# Patient Record
Sex: Male | Born: 2000 | Race: White | Hispanic: Yes | Marital: Single
Health system: Southern US, Community
[De-identification: ages and names within clinical notes are randomized; demographics above are authoritative.]

## PROBLEM LIST (undated history)

## (undated) DIAGNOSIS — J45909 Unspecified asthma, uncomplicated: Secondary | ICD-10-CM

---
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
## 2021-07-04 ENCOUNTER — Emergency Department (HOSPITAL_COMMUNITY)
Admission: EM | Admit: 2021-07-04 | Discharge: 2021-07-05 | Disposition: A | Discharge status: Home / Self Care | Payer: Medicaid Other | Attending: Emergency Medicine | Admitting: Emergency Medicine

## 2021-07-04 ENCOUNTER — Other Ambulatory Visit: Payer: Self-pay

## 2021-07-04 ENCOUNTER — Encounter (HOSPITAL_COMMUNITY): Payer: Self-pay | Admitting: Emergency Medicine

## 2021-07-04 DIAGNOSIS — R519 Headache, unspecified: Secondary | ICD-10-CM | POA: Diagnosis present

## 2021-07-04 DIAGNOSIS — Z91010 Allergy to peanuts: Secondary | ICD-10-CM | POA: Insufficient documentation

## 2021-07-04 DIAGNOSIS — J4521 Mild intermittent asthma with (acute) exacerbation: Secondary | ICD-10-CM | POA: Diagnosis not present

## 2021-07-04 DIAGNOSIS — J101 Influenza due to other identified influenza virus with other respiratory manifestations: Secondary | ICD-10-CM | POA: Insufficient documentation

## 2021-07-04 DIAGNOSIS — Z20822 Contact with and (suspected) exposure to covid-19: Secondary | ICD-10-CM | POA: Insufficient documentation

## 2021-07-04 DIAGNOSIS — M25532 Pain in left wrist: Secondary | ICD-10-CM | POA: Diagnosis not present

## 2021-07-04 DIAGNOSIS — M79641 Pain in right hand: Secondary | ICD-10-CM | POA: Diagnosis not present

## 2021-07-04 NOTE — ED Triage Notes (Signed)
Pt with flu-like symptoms for 2 days.  Poor appetite, cough, fever, chills, body aches. OTC meds given by mom.  Afebrile at time of triage.

## 2021-07-05 ENCOUNTER — Emergency Department (HOSPITAL_COMMUNITY): Payer: Medicaid Other

## 2021-07-05 LAB — RESP PANEL BY RT-PCR (FLU A&B, COVID) ARPGX2
Influenza A by PCR: POSITIVE — AB
Influenza B by PCR: NEGATIVE
SARS Coronavirus 2 by RT PCR: NEGATIVE

## 2021-07-05 MED ORDER — AEROCHAMBER PLUS FLO-VU MEDIUM MISC
1.0000 | Freq: Once | Status: AC
Start: 2021-07-05 — End: 2021-07-05
  Administered 2021-07-05: 1
  Filled 2021-07-05: qty 1

## 2021-07-05 MED ORDER — PREDNISONE 20 MG PO TABS
60.0000 mg | ORAL_TABLET | Freq: Once | ORAL | Status: AC
  Administered 2021-07-05: 60 mg via ORAL
  Filled 2021-07-05: qty 3

## 2021-07-05 MED ORDER — PREDNISONE 20 MG PO TABS
60.0000 mg | ORAL_TABLET | Freq: Once | ORAL | Status: DC
  Filled 2021-07-05: qty 3

## 2021-07-05 MED ORDER — ALBUTEROL SULFATE HFA 108 (90 BASE) MCG/ACT IN AERS
2.0000 | INHALATION_SPRAY | RESPIRATORY_TRACT | Status: DC | PRN
Start: 2021-07-05 — End: 2021-07-05
  Administered 2021-07-05: 2 via RESPIRATORY_TRACT
  Filled 2021-07-05 (×2): qty 6.7

## 2021-07-05 MED ORDER — PREDNISONE 20 MG PO TABS: 40.0000 mg | ORAL_TABLET | Freq: Every day | ORAL | 0 refills | Status: AC

## 2021-07-05 MED ORDER — IBUPROFEN 800 MG PO TABS
800.0000 mg | ORAL_TABLET | Freq: Once | ORAL | Status: AC
  Administered 2021-07-05: 800 mg via ORAL
  Filled 2021-07-05: qty 1

## 2021-07-05 MED ORDER — IBUPROFEN 400 MG PO TABS
600.0000 mg | ORAL_TABLET | Freq: Once | ORAL | Status: DC
  Filled 2021-07-05: qty 1

## 2021-07-05 MED ORDER — BENZONATATE 100 MG PO CAPS: 100.0000 mg | ORAL_CAPSULE | Freq: Three times a day (TID) | ORAL | 0 refills | Status: AC | PRN

## 2021-07-05 NOTE — Discharge Instructions (Signed)
1. Medications: Tessalon, Albuterol, usual home medications 2. Treatment: rest, drink plenty of fluids, alternate tylenol and ibuprofen for fever control - do not exceed 4g of tylenol including tylenol in other cold and flu medications 3. Follow Up: Please followup with your primary doctor in 3-5 days for discussion of your diagnoses and further evaluation after today's visit; if you do not have a primary care doctor use the resource guide provided to find one; Please return to the ER for difficulty breathing, inability to keep down fluids, altered mental status or other concerns.

## 2021-07-05 NOTE — ED Provider Notes (Signed)
Emergency Medicine Provider Triage Evaluation Note  Ethan Horton , a 20 y.o. male  was evaluated in triage.  Pt complains of flu-like symptoms onset yesterday.  Pt reports taking "all the medications" including CBD today without improvement.  Mother reports she gave acetaminophen at 6pm with some improvement in fever, but then symptoms returned.  Pt has a hz of asthma but has not been hospitalized since he was a small child.  Does not remember the last time he had prednisone. Pt reports he is most frustrated with his headache.   Pt reports he also became annoyed with his roommate today and punched a wall.  He is now having right hand and left wrist pain.   Review of Systems  Positive: Fever, chills, cough, headache, fatigue, myalgias, SOB Negative: N/V/D  Physical Exam  BP (!) 149/94 (BP Location: Left Arm)   Pulse 85   Temp 99.4 F (37.4 C) (Oral)   Resp 17   SpO2 98%  Gen:   Awake, no distress   Resp:  Normal effort, diminished, but clear breath sounds MSK:   Moves extremities without difficulty  Other:  Speaks in full sentences  Medical Decision Making  Medically screening exam initiated at 12:08 AM.  Appropriate orders placed.  Ethan Horton was informed that the remainder of the evaluation will be completed by another provider, this initial triage assessment does not replace that evaluation, and the importance of remaining in the ED until their evaluation is complete.  Influenza like illness.  Will treat asthma.   Punched a wall - right hand and left wrist pain - x-rays pending.   Ethan Horton, Ethan Horton 07/05/21 0011    Nira Conn, MD 07/05/21 [phone]

## 2021-07-05 NOTE — ED Provider Notes (Signed)
[facility] EMERGENCY DEPARTMENT Provider Note   CSN: [id number] Arrival date & time: 07/04/21  2319     History Chief Complaint  Patient presents with   Generalized Body Aches    Ethan Horton is a 20 y.o. male presents to the ER with flu-like symptoms onset yesterday.  Pt reports taking "all the medications" including CBD today without improvement.  Pt reports he smokes marijuana every day. Mother reports she gave acetaminophen at 6pm with some improvement in fever, but then symptoms returned.  Pt has a hx of asthma but has not been hospitalized since he was a small child.  Does not remember the last time he had prednisone. Pt reports he is most frustrated with his headache.    Pt reports he also became annoyed with his roommate today and punched a wall with both hands.  He is now having right hand and left wrist pain. No open wounds.   The history is provided by the patient, a parent and medical records. No language interpreter was used.      Past Medical History:  Diagnosis Date   Asthma     There are no problems to display for this patient.   No past surgical history on file.     No family history on file.  Social History   Substance Use Topics   Alcohol use: No    Home Medications Prior to Admission medications   Medication Sig Start Date End Date Taking? Authorizing Provider  benzonatate (TESSALON PERLES) 100 MG capsule Take 1 capsule (100 mg total) by mouth 3 (three) times daily as needed for cough (cough). 07/05/21  Yes Winford Hehn, Jarrett Soho, PA-C  predniSONE (DELTASONE) 20 MG tablet Take 2 tablets (40 mg total) by mouth daily. 07/05/21  Yes Ardie Dragoo, Jarrett Soho, PA-C  albuterol (PROVENTIL HFA;VENTOLIN HFA) 108 (90 Base) MCG/ACT inhaler Inhale 2 puffs into the lungs every 4 (four) hours as needed for wheezing or shortness of breath.    [provider]  cetirizine (ZYRTEC) 10 MG tablet Take 10 mg by mouth daily.    [provider]  dicyclomine (BENTYL) 10 MG capsule Take 10 mg by mouth 4 (four) times daily -  before meals and at bedtime.    [provider]  ondansetron (ZOFRAN ODT) 4 MG disintegrating tablet Take 1 tablet (4 mg total) by mouth every 8 (eight) hours as needed for nausea or vomiting. 08/18/17   Virgel Manifold, MD    Allergies    Peanut butter flavor and Wasp venom  Review of Systems   Review of Systems  Constitutional:  Positive for chills, fatigue and fever. Negative for appetite change, diaphoresis and unexpected weight change.  HENT:  Positive for congestion and sore throat. Negative for mouth sores.   Eyes:  Negative for visual disturbance.  Respiratory:  Positive for cough, shortness of breath and wheezing. Negative for chest tightness.   Cardiovascular:  Negative for chest pain.  Gastrointestinal:  Negative for abdominal pain, constipation, diarrhea, nausea and vomiting.  Endocrine: Negative for polydipsia, polyphagia and polyuria.  Genitourinary:  Negative for dysuria, frequency, hematuria and urgency.  Musculoskeletal:  Positive for myalgias. Negative for back pain and neck stiffness.  Skin:  Negative for rash.  Allergic/Immunologic: Negative for immunocompromised state.  Neurological:  Positive for headaches. Negative for syncope and light-headedness.  Hematological:  Does not bruise/bleed easily.  Psychiatric/Behavioral:  Negative for sleep disturbance. The patient is not nervous/anxious.    Physical Exam Updated Vital Signs BP Marland Kitchen)  149/94 (BP Location: Left Arm)   Pulse 85   Temp 99.4 F (37.4 C) (Oral)   Resp 17   SpO2 98%   Physical Exam Vitals and nursing note reviewed.  Constitutional:      General: He is not in acute distress.    Appearance: He is not diaphoretic.  HENT:     Head: Normocephalic.  Eyes:     General: No scleral icterus.    Conjunctiva/sclera: Conjunctivae normal.  Cardiovascular:     Rate and Rhythm: Normal rate and regular rhythm.      Pulses: Normal pulses.          Radial pulses are 2+ on the right side and 2+ on the left side.  Pulmonary:     Effort: Pulmonary effort is normal. No tachypnea, accessory muscle usage, prolonged expiration, respiratory distress or retractions.     Breath sounds: Normal breath sounds. No stridor.     Comments: Equal chest rise. No increased work of breathing. Abdominal:     General: There is no distension.     Palpations: Abdomen is soft.     Tenderness: There is no abdominal tenderness. There is no guarding or rebound.  Musculoskeletal:     Right elbow: Normal.     Left elbow: Normal.     Right forearm: Normal.     Left forearm: Normal.     Right wrist: Normal.     Left wrist: Tenderness present. Normal range of motion.     Right hand: Tenderness present. No swelling, lacerations or bony tenderness. Normal range of motion. Normal strength. Normal sensation.     Left hand: Normal.     Cervical back: Normal range of motion.     Comments: Moves all extremities equally and without difficulty.  Skin:    General: Skin is warm and dry.     Capillary Refill: Capillary refill takes less than 2 seconds.  Neurological:     Mental Status: He is alert.     GCS: GCS eye subscore is 4. GCS verbal subscore is 5. GCS motor subscore is 6.     Comments: Speech is clear and goal oriented.  Psychiatric:        Mood and Affect: Mood normal.    ED Results / Procedures / Treatments   Labs (all labs ordered are listed, but only abnormal results are displayed) Labs Reviewed  RESP PANEL BY RT-PCR (FLU A&B, COVID) ARPGX2 - Abnormal; Notable for the following components:      Result Value   Influenza A by PCR POSITIVE (*)    All other components within normal limits     Radiology DG Wrist Complete Left  Result Date: 07/05/2021 CLINICAL DATA:  Initial evaluation for acute trauma. EXAM: LEFT WRIST - COMPLETE 3+ VIEW COMPARISON:  None. FINDINGS: There is no evidence of fracture or dislocation.  There is no evidence of arthropathy or other focal bone abnormality. Soft tissues are unremarkable. IMPRESSION: Negative. Electronically Signed   By: Jeannine Boga M.D.   On: 07/05/2021 00:45   DG Hand Complete Right  Result Date: 07/05/2021 CLINICAL DATA:  Initial evaluation for acute trauma. EXAM: RIGHT HAND - COMPLETE 3+ VIEW COMPARISON:  None. FINDINGS: There is no evidence of fracture or dislocation. There is no evidence of arthropathy or other focal bone abnormality. Soft tissues are unremarkable. IMPRESSION: Negative. Electronically Signed   By: Jeannine Boga M.D.   On: 07/05/2021 00:44    Procedures Procedures   Medications Ordered in  ED Medications  albuterol (VENTOLIN HFA) 108 (90 Base) MCG/ACT inhaler 2 puff (2 puffs Inhalation Given 07/05/21 0443)  AeroChamber Plus Flo-Vu Medium MISC 1 each (1 each Other Given 07/05/21 0444)  predniSONE (DELTASONE) tablet 60 mg (60 mg Oral Given 07/05/21 0443)  ibuprofen (ADVIL) tablet 800 mg (800 mg Oral Given 07/05/21 0443)    ED Course  I have reviewed the triage vital signs and the nursing notes.  Pertinent labs & imaging results that were available during my care of the patient were reviewed by me and considered in my medical decision making (see chart for details).    MDM Rules/Calculators/A&P                           Patient with symptoms consistent with influenza.  Vitals are stable, low-grade fever.  No signs of dehydration, tolerating PO's.  Pt initially with coarse breath sounds but no wheezing at triage - clear and equal on repeat exam. Due to patient's presentation and physical exam a chest x-ray was not ordered bc likely diagnosis of flu.    4:28 AM Influenza positive. X-rays negative.  Patient will be discharged with instructions to orally hydrate, rest, and use over-the-counter medications such as anti-inflammatories ibuprofen and Aleve for muscle aches and Tylenol for fever.  Patient will also be given a  cough suppressant, and albuterol.   Will treat possible asthma exacerbation with Prednisone. Pt given albuterol here in the ED.    Final Clinical Impression(s) / ED Diagnoses Final diagnoses:  Influenza A  Mild intermittent asthma with exacerbation    Rx / DC Orders ED Discharge Orders          Ordered    predniSONE (DELTASONE) 20 MG tablet  Daily        07/05/21 0427    benzonatate (TESSALON PERLES) 100 MG capsule  3 times daily PRN        07/05/21 0427             Lynton Crescenzo, Dahlia Client, PA-C 07/05/21 0456    Cardama, Amadeo Garnet, MD 07/05/21 [phone]

## 2021-12-20 IMAGING — CR DG HAND COMPLETE 3+V*R*
4 series · 4 of 4 positions shown · non-contrast
Comparison: None.

CLINICAL DATA: Initial evaluation for acute trauma.

EXAM:
RIGHT HAND - COMPLETE 3+ VIEW

[hand pa]
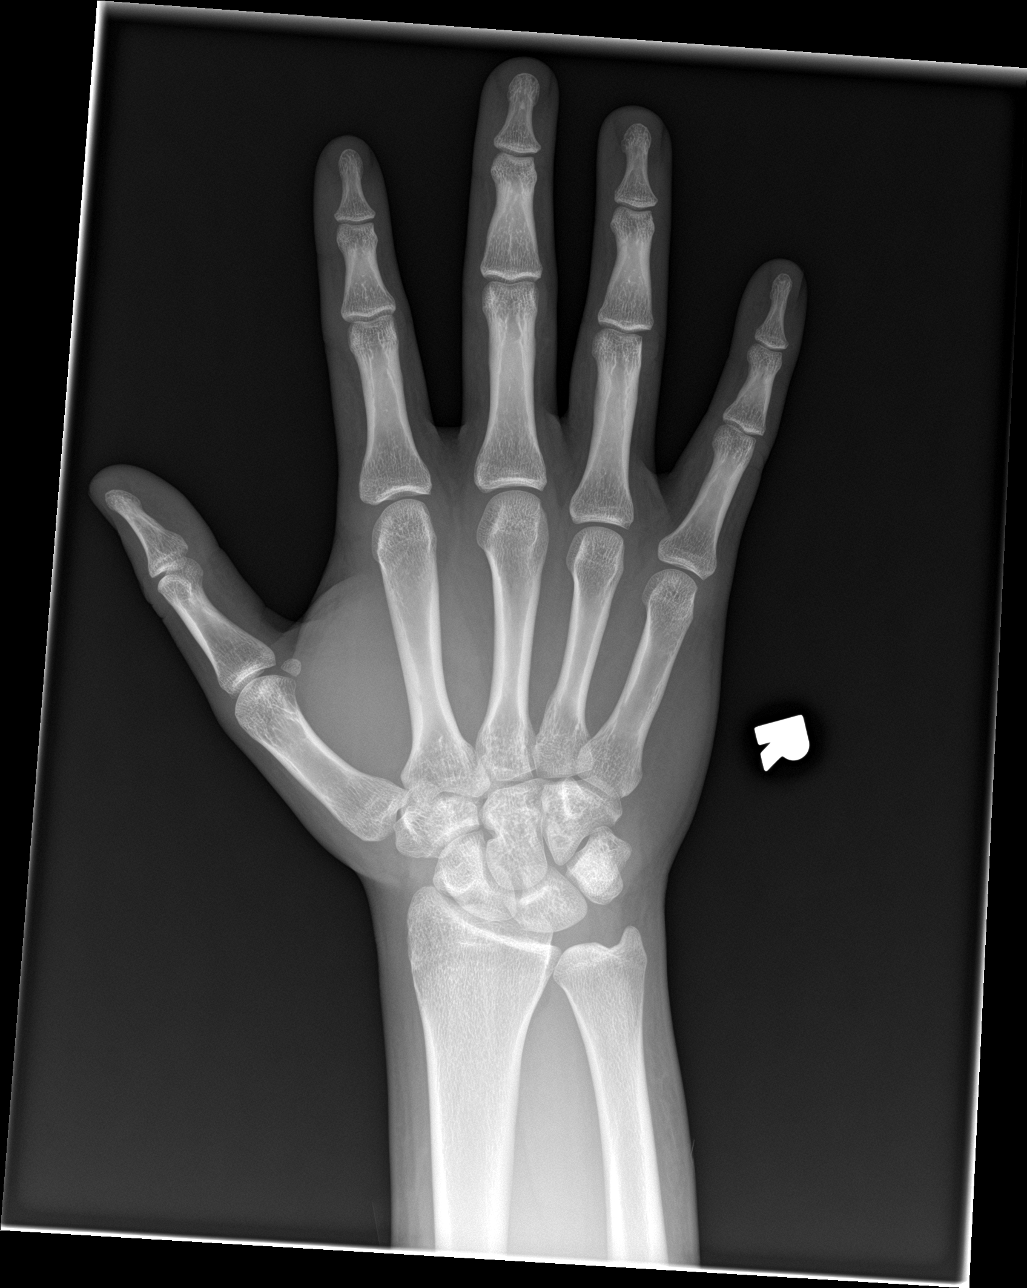

[hand obl]
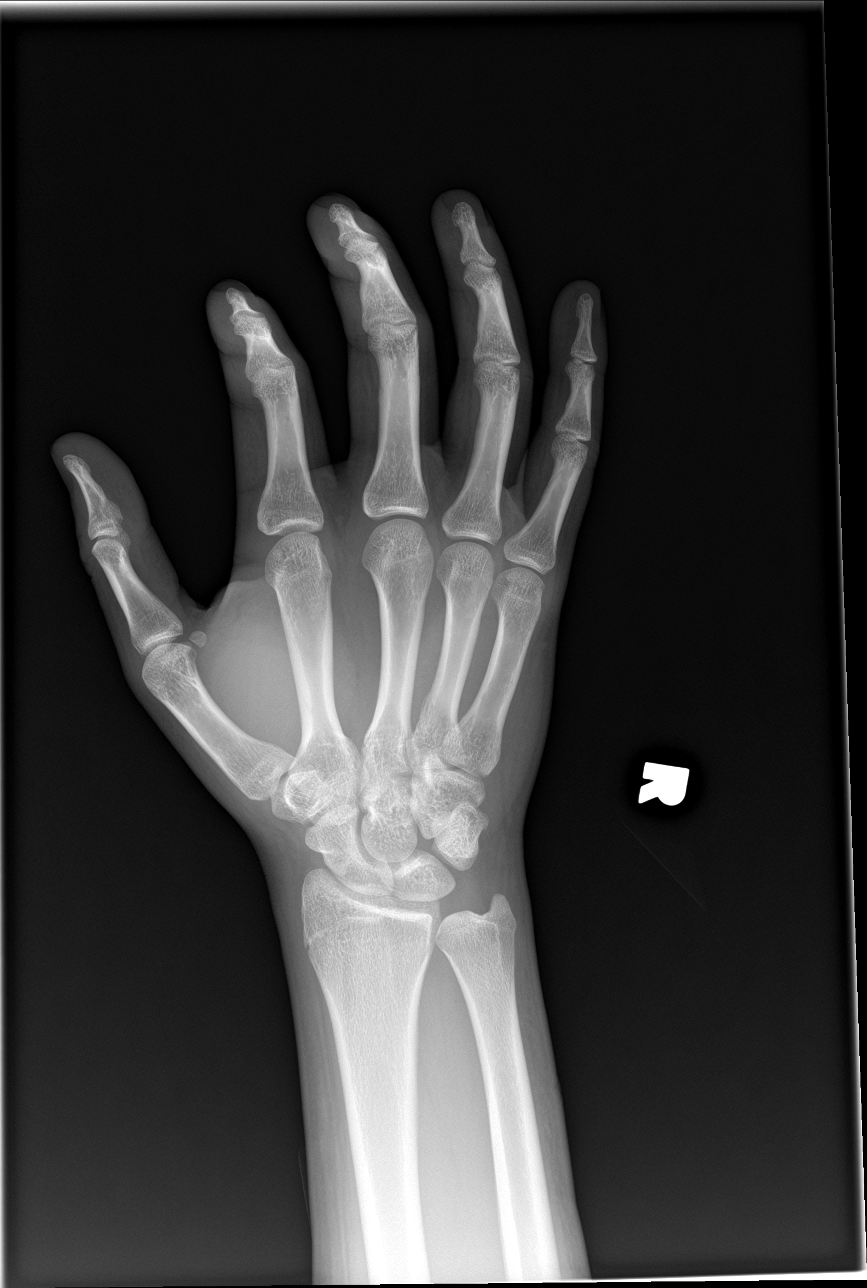

[hand lat (1 of 2)]
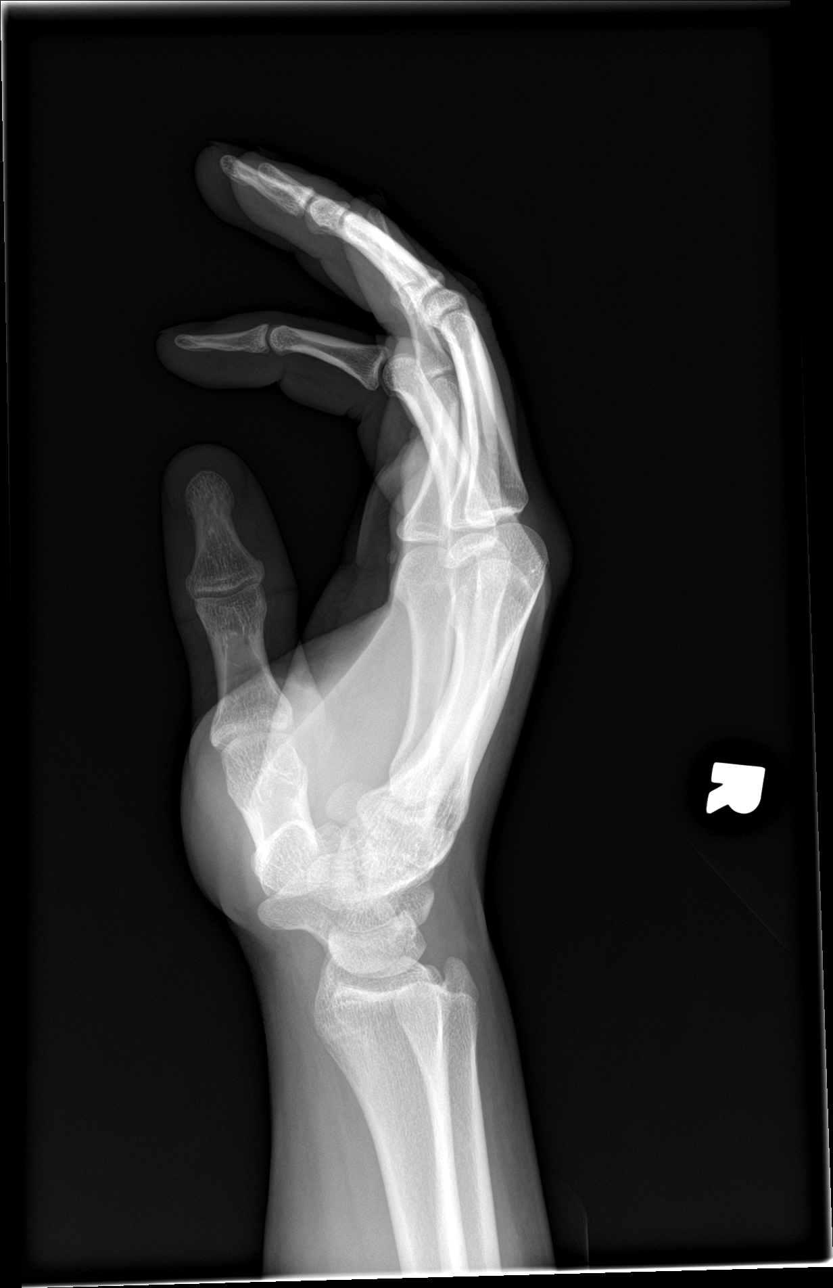

[hand lat (2 of 2)]
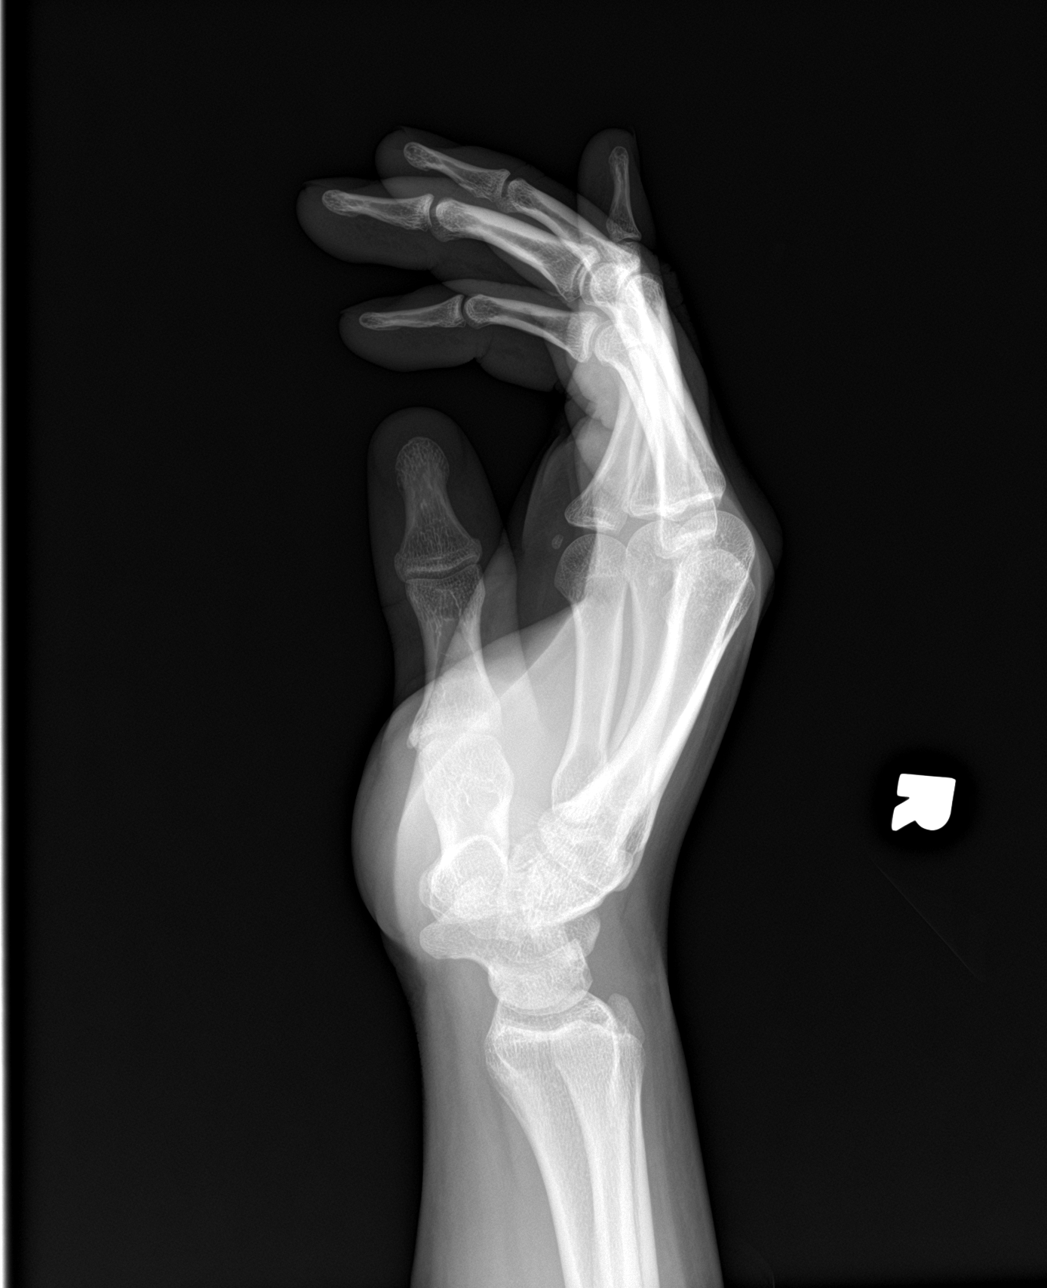

[4 of 4 positions shown; findings below may reference images not displayed]

FINDINGS: There is no evidence of fracture or dislocation. There is no
evidence of arthropathy or other focal bone abnormality. Soft
tissues are unremarkable.
IMPRESSION: Negative.

## 2024-06-15 ENCOUNTER — Ambulatory Visit
Admission: EM | Admit: 2024-06-15 | Discharge: 2024-06-15 | Disposition: A | Discharge status: Home / Self Care | Attending: Urgent Care | Admitting: Urgent Care

## 2024-06-15 ENCOUNTER — Ambulatory Visit: Admission: EM | Admit: 2024-06-15 | Discharge: 2024-06-15 | Discharge status: Against Medical Advice

## 2024-06-15 DIAGNOSIS — L0100 Impetigo, unspecified: Secondary | ICD-10-CM

## 2024-06-15 MED ORDER — MUPIROCIN 2 % EX OINT: 1.0000 | TOPICAL_OINTMENT | Freq: Three times a day (TID) | CUTANEOUS | 0 refills | Status: AC

## 2024-06-15 MED ORDER — DOXYCYCLINE HYCLATE 100 MG PO CAPS: 100.0000 mg | ORAL_CAPSULE | Freq: Two times a day (BID) | ORAL | 0 refills | Status: AC

## 2024-06-15 NOTE — ED Notes (Signed)
 Pt did not return to clinic.  LWBS

## 2024-06-15 NOTE — ED Triage Notes (Signed)
 Pt states rash to his face for the past 3 weeks.  States he is allergic to peanuts and has been eating candy with peanuts since halloween.  Red rash noted around mouth.

## 2024-06-15 NOTE — ED Notes (Signed)
 Pt not in waiting room when called to room.  Told patient accesses that he would be right back.

## 2024-06-15 NOTE — ED Provider Notes (Signed)
 [facility]  Note:  This document was prepared using Conservation officer, historic buildings and may include unintentional dictation errors.  MRN: [id number] DOB: 03-17-2001  Subjective:   Ethan Horton is a 23 y.o. male presenting for 3-week history of persistent worsening rash over the lower part of his face.  Patient has used multiple topical treatments but is not helping.  The area continues to crust over, sometimes oozes.  No facial or oral swelling, difficulty breathing, nausea, vomiting.  No current facility-administered medications for this encounter.  Current Outpatient Medications:    albuterol  (PROVENTIL  HFA;VENTOLIN  HFA) 108 (90 Base) MCG/ACT inhaler, Inhale 2 puffs into the lungs every 4 (four) hours as needed for wheezing or shortness of breath., Disp: , Rfl:    benzonatate  (TESSALON  PERLES) 100 MG capsule, Take 1 capsule (100 mg total) by mouth 3 (three) times daily as needed for cough (cough)., Disp: 20 capsule, Rfl: 0   cetirizine (ZYRTEC) 10 MG tablet, Take 10 mg by mouth daily., Disp: , Rfl:    dicyclomine (BENTYL) 10 MG capsule, Take 10 mg by mouth 4 (four) times daily -  before meals and at bedtime., Disp: , Rfl:    ondansetron  (ZOFRAN  ODT) 4 MG disintegrating tablet, Take 1 tablet (4 mg total) by mouth every 8 (eight) hours as needed for nausea or vomiting., Disp: 10 tablet, Rfl: 0   predniSONE  (DELTASONE ) 20 MG tablet, Take 2 tablets (40 mg total) by mouth daily., Disp: 10 tablet, Rfl: 0   Allergies  Allergen Reactions   Peanut Butter Flavoring Agent (Non-Screening) Anaphylaxis   Wasp Venom Anaphylaxis    Past Medical History:  Diagnosis Date   Asthma      History reviewed. No pertinent surgical history.  History reviewed. No pertinent family history.  Social History   Tobacco Use   Smoking status: Never   Smokeless tobacco: Never  Substance Use Topics   Alcohol use: Yes   Drug use: Yes    Types: Marijuana    ROS   Objective:    Vitals: BP 130/84 (BP Location: Right Arm)   Pulse (!) 59   Temp 98.7 F (37.1 C) (Oral)   Resp 20   SpO2 98%   Physical Exam Constitutional:      General: He is not in acute distress.    Appearance: Normal appearance. He is well-developed and normal weight. He is not ill-appearing, toxic-appearing or diaphoretic.  HENT:     Head: Normocephalic and atraumatic.      Comments: No oral swelling, facial swelling.    Right Ear: External ear normal.     Left Ear: External ear normal.     Nose: Nose normal.     Mouth/Throat:     Pharynx: Oropharynx is clear. No pharyngeal swelling, oropharyngeal exudate, posterior oropharyngeal erythema or uvula swelling.     Tonsils: No tonsillar exudate or tonsillar abscesses. 0 on the right. 0 on the left.  Eyes:     General: No scleral icterus.       Right eye: No discharge.        Left eye: No discharge.     Extraocular Movements: Extraocular movements intact.  Cardiovascular:     Rate and Rhythm: Normal rate.  Pulmonary:     Effort: Pulmonary effort is normal.  Musculoskeletal:     Cervical back: Normal range of motion.  Neurological:     Mental Status: He is alert and oriented to person, place, and time.  Psychiatric:  Mood and Affect: Mood normal.        Behavior: Behavior normal.        Thought Content: Thought content normal.        Judgment: Judgment normal.     Assessment and Plan :   PDMP not reviewed this encounter.  1. Impetigo    Patient has significant impetigo and therefore recommended doxycycline  and topical Bactroban .  Counseled patient on potential for adverse effects with medications prescribed/recommended today, ER and return-to-clinic precautions discussed, patient verbalized understanding.    Christopher Savannah, PA-C 06/15/24 1135
